# Patient Record
Sex: Male | Born: 1960 | Race: White | Hispanic: No | Marital: Married | Smoking: Former smoker
Health system: Southern US, Community
[De-identification: ages and names within clinical notes are randomized; demographics above are authoritative.]

## PROBLEM LIST (undated history)

## (undated) DIAGNOSIS — E7800 Pure hypercholesterolemia, unspecified: Secondary | ICD-10-CM

## (undated) DIAGNOSIS — M109 Gout, unspecified: Secondary | ICD-10-CM

## (undated) DIAGNOSIS — K219 Gastro-esophageal reflux disease without esophagitis: Secondary | ICD-10-CM

## (undated) HISTORY — DX: Gout, unspecified: M10.9

## (undated) HISTORY — DX: Gastro-esophageal reflux disease without esophagitis: K21.9

## (undated) HISTORY — DX: Pure hypercholesterolemia, unspecified: E78.00

---

## 1998-12-07 ENCOUNTER — Encounter: Payer: Self-pay | Admitting: Gastroenterology

## 1998-12-07 ENCOUNTER — Ambulatory Visit (HOSPITAL_COMMUNITY): Admission: RE | Admit: 1998-12-07 | Discharge: 1998-12-07 | Payer: Self-pay | Admitting: Gastroenterology

## 1998-12-31 ENCOUNTER — Ambulatory Visit (HOSPITAL_COMMUNITY): Admission: RE | Admit: 1998-12-31 | Discharge: 1998-12-31 | Payer: Self-pay | Admitting: Gastroenterology

## 1998-12-31 ENCOUNTER — Encounter: Payer: Self-pay | Admitting: Gastroenterology

## 1999-03-02 ENCOUNTER — Ambulatory Visit (HOSPITAL_COMMUNITY): Admission: RE | Admit: 1999-03-02 | Discharge: 1999-03-02 | Payer: Self-pay | Admitting: Gastroenterology

## 2013-05-01 ENCOUNTER — Ambulatory Visit (INDEPENDENT_AMBULATORY_CARE_PROVIDER_SITE_OTHER): Payer: BC Managed Care – PPO | Admitting: Podiatry

## 2013-05-01 ENCOUNTER — Encounter: Payer: Self-pay | Admitting: Podiatry

## 2013-05-01 ENCOUNTER — Ambulatory Visit (INDEPENDENT_AMBULATORY_CARE_PROVIDER_SITE_OTHER): Payer: BC Managed Care – PPO

## 2013-05-01 VITALS — BP 118/77 | HR 70 | Resp 12 | Ht 74.0 in | Wt 238.0 lb

## 2013-05-01 DIAGNOSIS — R52 Pain, unspecified: Secondary | ICD-10-CM

## 2013-05-01 DIAGNOSIS — M722 Plantar fascial fibromatosis: Secondary | ICD-10-CM

## 2013-05-01 NOTE — Progress Notes (Signed)
N-SORE  L-LT FOOT HEEL D- 1 YEAR O-SLOWLY C-SAME A-PRESSURE T-CORTISONE SHOTS

## 2013-05-01 NOTE — Progress Notes (Signed)
   Subjective:    Patient ID: Raymond Mckenzie, male    DOB: [birth year], 53 y.o.   MRN: [id number]  HPI    Review of Systems  Musculoskeletal: Positive for gait problem.  Neurological: Positive for numbness.  All other systems reviewed and are negative.       Objective:   Physical Exam        Assessment & Plan:

## 2013-05-02 NOTE — Progress Notes (Signed)
Subjective:     Patient ID: Raymond Mckenzie, male   DOB: 1960-07-17, 53 y.o.   MRN: [id number]  HPI patient states that he is having chronic pain in his left heel of at least 18 months duration and states he has had 4 injections physical therapy inserts and stretching exercises without relief of symptoms. He cannot exercise or do any physical activity without pain   Review of Systems  All other systems reviewed and are negative.       Objective:   Physical Exam  Nursing note and vitals reviewed. Constitutional: He is oriented to person, place, and time.  Cardiovascular: Intact distal pulses.   Musculoskeletal: Normal range of motion.  Neurological: He is oriented to person, place, and time.  Skin: Skin is warm.   neurovascular status intact with muscle strength adequate and no equinus condition noted. Severe discomfort to palpation medial calcaneal tubercle left with inflammation of the plantar fascia noted. Mild changes in the lateral side and lateral foot Achilles tendon secondary to compensation in gait pattern    Assessment:     Chronic plantar fasciitis that has failed to respond to numerous conservative care options at this time    Plan:     H&P and x-ray reviewed with patient and wife. Discussed at great length length of condition and different treatments that have been performed up to this point and the consideration long-term for further injection along with immobilization versus shockwave versus open surgery. Due to length of time and failure to respond to numerous conservative care as he has opted for surgery in this case at this time I allowed him to reviewed consent form concerning endoscopic release of the medial band fascia. I explained all possible complications including chronic arch pain foot pain lateral pain and the fact that total recovery period can be 6 months to one year for this procedure. Patient understands all this wants surgery signs consent form and this  dispense air fracture walker with instructions on usage for surgery. Scheduled in the next 2-3 weeks and encouraged to call with any questions

## 2013-05-28 DIAGNOSIS — M722 Plantar fascial fibromatosis: Secondary | ICD-10-CM

## 2013-06-03 ENCOUNTER — Encounter: Payer: BC Managed Care – PPO | Admitting: Podiatry

## 2013-06-03 ENCOUNTER — Ambulatory Visit (INDEPENDENT_AMBULATORY_CARE_PROVIDER_SITE_OTHER): Payer: BC Managed Care – PPO | Admitting: Podiatry

## 2013-06-03 VITALS — BP 143/89 | HR 82 | Resp 16 | Ht 74.0 in | Wt 238.0 lb

## 2013-06-03 DIAGNOSIS — M722 Plantar fascial fibromatosis: Secondary | ICD-10-CM

## 2013-06-03 NOTE — Progress Notes (Signed)
Pt states he's doing fine and ready to get to work and back into a regular shoe.

## 2013-06-03 NOTE — Progress Notes (Signed)
Subjective:     Patient ID: Raymond Mckenzie, male   DOB: 10/31/1960, 53 y.o.   MRN: [id number]  HPI patient states that I am doing fine and walking with minimal discomfort on my heel   Review of Systems     Objective:   Physical Exam Neurovascular status intact with negative Homans sign noted and incision sites that are coapted well with stitches intact both sides of the heel with minimal edema erythema or drainage not noted    Assessment:     Healing well post endoscopic surgery of the heel    Plan:     Reapplied sterile dressing dispense Darco shoe and instructed on continued immobilization until I see patient back in 2 weeks. Reappoint earlier if any issues should occur

## 2013-06-19 ENCOUNTER — Encounter: Payer: Self-pay | Admitting: Podiatry

## 2013-06-19 ENCOUNTER — Ambulatory Visit (INDEPENDENT_AMBULATORY_CARE_PROVIDER_SITE_OTHER): Payer: BC Managed Care – PPO | Admitting: Podiatry

## 2013-06-19 VITALS — BP 122/69 | HR 69 | Resp 12

## 2013-06-19 DIAGNOSIS — M722 Plantar fascial fibromatosis: Secondary | ICD-10-CM

## 2013-06-21 NOTE — Progress Notes (Signed)
Subjective:     Patient ID: Colletta Marylandoger D Thurlow JR, male   DOB: 08/10/1960, 53 y.o.   MRN: [id number]  HPI patient presents stating he is doing fairly well with his foot and heel surgery. 3 weeks after procedure  Review of Systems     Objective:   Physical Exam Neurovascular status intact negative Homans sign noted with wound edges well corrected stitches in place with no drainage redness and no plantar pain in the heel note    Assessment:     Doing very well endoscopic release fascia    Plan:     Stitches are removed wound edges coapted well applied sterile dressing and dispensed anklet. Gradual continued return to saw shoe gear with boot usage as needed and reappoint if any issues should occur

## 2013-08-29 ENCOUNTER — Ambulatory Visit (INDEPENDENT_AMBULATORY_CARE_PROVIDER_SITE_OTHER): Payer: BC Managed Care – PPO

## 2013-08-29 ENCOUNTER — Ambulatory Visit (INDEPENDENT_AMBULATORY_CARE_PROVIDER_SITE_OTHER): Payer: BC Managed Care – PPO | Admitting: Podiatry

## 2013-08-29 VITALS — BP 127/89 | HR 74 | Resp 15 | Ht 74.0 in | Wt 235.0 lb

## 2013-08-29 DIAGNOSIS — M79609 Pain in unspecified limb: Secondary | ICD-10-CM

## 2013-08-29 DIAGNOSIS — M7750 Other enthesopathy of unspecified foot: Secondary | ICD-10-CM

## 2013-08-29 MED ORDER — TRIAMCINOLONE ACETONIDE 10 MG/ML IJ SUSP
10.0000 mg | Freq: Once | INTRAMUSCULAR | Status: AC
  Administered 2013-08-29: 10 mg

## 2013-08-29 MED ORDER — DICLOFENAC SODIUM 75 MG PO TBEC: 75.0000 mg | DELAYED_RELEASE_TABLET | Freq: Two times a day (BID) | ORAL | Status: DC

## 2013-08-29 NOTE — Progress Notes (Signed)
   Subjective:    Patient ID: Raymond Mckenzie, male    DOB: 04-Feb-1961, 53 y.o.   MRN: [id number]  HPI Comments: Pt complains of soreness in left 2 - 4 MPJ through the day, worsening, and worse still when push off toes to step and to dorsiflex.  Pt states this has been going on 1 month.     Review of Systems  All other systems reviewed and are negative.      Objective:   Physical Exam        Assessment & Plan:

## 2013-08-29 NOTE — Progress Notes (Signed)
Subjective:     Patient ID: Raymond Mckenzie JR, male   DOB: [birth year], 53 y.o.   MRN: [id number]  HPI patient presents stating my heel is doing really well but I getting discomfort in my forefoot with pain if I try to be very active on. Several months after having plantar fascial release left   Review of Systems     Objective:   Physical Exam Neurovascular status intact no health history changes noted with inflammation mostly centered in the second metatarsophalangeal joint left with mild discomfort across the entire metatarsophalangeal area    Assessment:     Acute capsulitis of the second MPJ left with probable change in gait and improvement with gait process do to heal feeling better as a issue with this    Plan:     Reviewed the condition and x-ray. Did a proximal nerve block and then after explaining risk of rupture of the joint aspirated the second MPJ and injected with half cc of dexamethasone Kenalog and applied thick plantar padding. Scanned for custom orthotics to reduce all forefoot stress and patient will be seen back when those are returned

## 2013-09-02 ENCOUNTER — Ambulatory Visit: Payer: BC Managed Care – PPO | Admitting: Podiatry

## 2013-09-20 ENCOUNTER — Ambulatory Visit (INDEPENDENT_AMBULATORY_CARE_PROVIDER_SITE_OTHER): Payer: BC Managed Care – PPO | Admitting: *Deleted

## 2013-09-20 DIAGNOSIS — M722 Plantar fascial fibromatosis: Secondary | ICD-10-CM

## 2013-09-20 NOTE — Patient Instructions (Signed)

## 2013-09-20 NOTE — Progress Notes (Signed)
   Subjective:    Patient ID: Raymond Mckenzie, male    DOB: 1961/02/13, 53 y.o.   MRN: [id number]  HPI PICK UP ORTHOTICS AND GIVEN INSTRUCTION.    Review of Systems     Objective:   Physical Exam        Assessment & Plan:

## 2013-09-26 ENCOUNTER — Ambulatory Visit: Payer: BC Managed Care – PPO | Admitting: Podiatry

## 2014-09-05 ENCOUNTER — Ambulatory Visit (INDEPENDENT_AMBULATORY_CARE_PROVIDER_SITE_OTHER): Payer: BLUE CROSS/BLUE SHIELD

## 2014-09-05 ENCOUNTER — Ambulatory Visit (INDEPENDENT_AMBULATORY_CARE_PROVIDER_SITE_OTHER): Payer: BLUE CROSS/BLUE SHIELD | Admitting: Podiatry

## 2014-09-05 DIAGNOSIS — R52 Pain, unspecified: Secondary | ICD-10-CM | POA: Diagnosis not present

## 2014-09-05 DIAGNOSIS — M722 Plantar fascial fibromatosis: Secondary | ICD-10-CM | POA: Diagnosis not present

## 2014-09-05 MED ORDER — TRIAMCINOLONE ACETONIDE 10 MG/ML IJ SUSP
10.0000 mg | Freq: Once | INTRAMUSCULAR | Status: AC
  Administered 2014-09-05: 10 mg

## 2014-09-05 NOTE — Progress Notes (Signed)
   Subjective:    Patient ID: Raymond Mckenzie, male    DOB: 03/10/1961, 54 y.o.   MRN: [id number]  HPI  Right heel pain     Review of Systems  All other systems reviewed and are negative.      Objective:   Physical Exam        Assessment & Plan:

## 2014-09-07 NOTE — Progress Notes (Signed)
Subjective:     Patient ID: Raymond Mckenzie, male   DOB: [birth year], 54 y.o.   MRN: [id number]  HPI patient states my right heel has started to hurt me quite a bit and is sore when I pressed and when I get up in the morning or after sitting   Review of Systems     Objective:   Physical Exam Neurovascular status intact muscle strength adequate range of motion within normal limits. Pain in the plantar aspect right heel at the insertional point of the tendon into the calcaneus    Assessment:     Plantar fasciitis acute nature right    Plan:     Injected the right plantar fascia 3 Milligan Kenalog 5 mg Xylocaine and advised on physical therapy and supportive shoe gear usage

## 2014-09-25 ENCOUNTER — Ambulatory Visit: Payer: BLUE CROSS/BLUE SHIELD | Admitting: Podiatry

## 2015-03-04 ENCOUNTER — Ambulatory Visit (INDEPENDENT_AMBULATORY_CARE_PROVIDER_SITE_OTHER): Payer: BLUE CROSS/BLUE SHIELD | Admitting: Podiatry

## 2015-03-04 ENCOUNTER — Encounter: Payer: Self-pay | Admitting: Podiatry

## 2015-03-04 VITALS — BP 130/80 | HR 64 | Resp 12

## 2015-03-04 DIAGNOSIS — M722 Plantar fascial fibromatosis: Secondary | ICD-10-CM

## 2015-03-04 DIAGNOSIS — M2042 Other hammer toe(s) (acquired), left foot: Secondary | ICD-10-CM

## 2015-03-04 MED ORDER — TRIAMCINOLONE ACETONIDE 10 MG/ML IJ SUSP
10.0000 mg | Freq: Once | INTRAMUSCULAR | Status: AC
  Administered 2015-03-04: 10 mg

## 2015-03-04 MED ORDER — MELOXICAM 15 MG PO TABS: 15.0000 mg | ORAL_TABLET | Freq: Every day | ORAL | Status: DC

## 2015-03-04 NOTE — Progress Notes (Signed)
Subjective:     Patient ID: Raymond RetortRoger D Mckenzie, male   DOB: [birth year], 54 y.o.   MRN: [id number]  HPI patient presents with right foot pain stating his heel has been hurting him for the last few months   Review of Systems     Objective:   Physical Exam  neurovascular status intact muscle strength adequate with exquisite discomfort plantar aspect right heel at the insertional point tendon calcaneus    Assessment:      plantar fasciitis right    Plan:      injected the right plantar fascia 3 mg Kenalog 5 mill grams Xylocaine and discussed possible surgery of the symptoms persist or come back quickly

## 2015-07-01 ENCOUNTER — Ambulatory Visit (INDEPENDENT_AMBULATORY_CARE_PROVIDER_SITE_OTHER): Payer: BLUE CROSS/BLUE SHIELD | Admitting: Podiatry

## 2015-07-01 ENCOUNTER — Encounter: Payer: Self-pay | Admitting: Podiatry

## 2015-07-01 ENCOUNTER — Ambulatory Visit (INDEPENDENT_AMBULATORY_CARE_PROVIDER_SITE_OTHER): Payer: BLUE CROSS/BLUE SHIELD

## 2015-07-01 VITALS — BP 133/82 | HR 66 | Resp 16

## 2015-07-01 DIAGNOSIS — M79671 Pain in right foot: Secondary | ICD-10-CM

## 2015-07-01 DIAGNOSIS — M722 Plantar fascial fibromatosis: Secondary | ICD-10-CM | POA: Diagnosis not present

## 2015-07-01 NOTE — Patient Instructions (Signed)
Pre-Operative Instructions  Congratulations, you have decided to take an important step to improving your quality of life.  You can be assured that the doctors of [facility] will be with you every step of the way.  1. Plan to be at the surgery center/hospital at least 1 (one) hour prior to your scheduled time unless otherwise directed by the surgical center/hospital staff.  You must have a responsible adult accompany you, remain during the surgery and drive you home.  Make sure you have directions to the surgical center/hospital and know how to get there on time. 2. For hospital based surgery you will need to obtain a history and physical form from your family physician within 1 month prior to the date of surgery- we will give you a form for you primary physician.  3. We make every effort to accommodate the date you request for surgery.  There are however, times where surgery dates or times have to be moved.  We will contact you as soon as possible if a change in schedule is required.   4. No Aspirin/Ibuprofen for one week before surgery.  If you are on aspirin, any non-steroidal anti-inflammatory medications (Mobic, Aleve, Ibuprofen) you should stop taking it 7 days prior to your surgery.  You make take Tylenol  For pain prior to surgery.  5. Medications- If you are taking daily heart and blood pressure medications, seizure, reflux, allergy, asthma, anxiety, pain or diabetes medications, make sure the surgery center/hospital is aware before the day of surgery so they may notify you which medications to take or avoid the day of surgery. 6. No food or drink after midnight the night before surgery unless directed otherwise by surgical center/hospital staff. 7. No alcoholic beverages 24 hours prior to surgery.  No smoking 24 hours prior to or 24 hours after surgery. 8. Wear loose pants or shorts- loose enough to fit over bandages, boots, and casts. 9. No slip on shoes, sneakers are best. 10. Bring  your boot with you to the surgery center/hospital.  Also bring crutches or a walker if your physician has prescribed it for you.  If you do not have this equipment, it will be provided for you after surgery. 11. If you have not been contracted by the surgery center/hospital by the day before your surgery, call to confirm the date and time of your surgery. 12. Leave-time from work may vary depending on the type of surgery you have.  Appropriate arrangements should be made prior to surgery with your employer. 13. Prescriptions will be provided immediately following surgery by your doctor.  Have these filled as soon as possible after surgery and take the medication as directed. 14. Remove nail polish on the operative foot. 15. Wash the night before surgery.  The night before surgery wash the foot and leg well with the antibacterial soap provided and water paying special attention to beneath the toenails and in between the toes.  Rinse thoroughly with water and dry well with a towel.  Perform this wash unless told not to do so by your physician.  Enclosed: 1 Ice pack (please put in freezer the night before surgery)   1 Hibiclens skin cleaner   Pre-op Instructions  If you have any questions regarding the instructions, do not hesitate to call our office.  [place]: [address] [place], [place] [address] [phone]  [place]: [address] [place], [place] [address] [phone]  [place]: [address]  [place], [place] [address] [phone]  Dr. Richard   Tuchman DPM, Dr. Norman Regal DPM Dr. Richard Sikora DPM, Dr. M. Todd Hyatt DPM, Dr. Kathryn Egerton DPM 

## 2015-07-02 NOTE — Progress Notes (Signed)
Subjective:     Patient ID: Raymond Mckenzie, male   DOB: 1961-01-04, 55 y.o.   MRN: [id number]  HPI patient states his right heel is driving him crazy and he wants to get it fixed like his left heel. States that the medication did not give him relief and orthotics and splints are not helping   Review of Systems     Objective:   Physical Exam Neurovascular status intact with severe discomfort plantar aspect right heel at the insertional point tendon into the calcaneus with well-healed surgical site left with no pain    Assessment:     Severe plantar fasciitis right that failed to respond conservatively with excellent result concerning the left heel    Plan:     Reviewed condition at great length and at this time allow patient to read consent form going over alternative treatments and complications. Due to long-term problems with this patient has opted for surgery and after reviewing consent form signs consent form understanding total recovery can take from 6 months to one year and that just because 1 did so well does not mean that the other will recover the same way. He understands this completely and is scheduled for outpatient surgery and is given all preoperative instructions and is encouraged to call with any questions he may have prior to procedure

## 2015-07-07 DIAGNOSIS — M722 Plantar fascial fibromatosis: Secondary | ICD-10-CM | POA: Diagnosis not present

## 2015-07-13 ENCOUNTER — Ambulatory Visit (INDEPENDENT_AMBULATORY_CARE_PROVIDER_SITE_OTHER): Payer: BLUE CROSS/BLUE SHIELD | Admitting: Podiatry

## 2015-07-13 ENCOUNTER — Encounter: Payer: Self-pay | Admitting: Podiatry

## 2015-07-13 VITALS — Temp 98.3°F

## 2015-07-13 DIAGNOSIS — M722 Plantar fascial fibromatosis: Secondary | ICD-10-CM

## 2015-07-13 DIAGNOSIS — Z9889 Other specified postprocedural states: Secondary | ICD-10-CM | POA: Diagnosis not present

## 2015-07-14 NOTE — Progress Notes (Signed)
Subjective:     Patient ID: Raymond RetortRoger D Jemison, male   DOB: 1960/11/06, 55 y.o.   MRN: [id number]  HPI patient states I'm doing very well with my right heel with minimal discomfort   Review of Systems     Objective:   Physical Exam Neurovascular status intact negative Homans sign noted with patient's right heel showing good alignment with minimal edema and wound edges that are well coapted with stitches in place    Assessment:     Doing well post endoscopic release of the medial fascial band right    Plan:     Reviewed condition and advised this patient on continued immobilization elevation and if any pain in the leg or any indications of problems were to occur to contact us immediately. Reappoint 2 weeks for suture removal or earlier if needed

## 2015-07-16 ENCOUNTER — Other Ambulatory Visit: Payer: Self-pay

## 2015-07-29 ENCOUNTER — Ambulatory Visit (INDEPENDENT_AMBULATORY_CARE_PROVIDER_SITE_OTHER): Payer: BLUE CROSS/BLUE SHIELD | Admitting: Podiatry

## 2015-07-29 DIAGNOSIS — Z9889 Other specified postprocedural states: Secondary | ICD-10-CM | POA: Diagnosis not present

## 2015-07-29 DIAGNOSIS — M722 Plantar fascial fibromatosis: Secondary | ICD-10-CM | POA: Diagnosis not present

## 2015-07-29 NOTE — Progress Notes (Signed)
Subjective:     Patient ID: Raymond Mckenzie, male   DOB: Oct 25, 1960, 55 y.o.   MRN: [id number]  HPI patient states my right foot is doing well with stitches intact and minimal discomfort   Review of Systems     Objective:   Physical Exam Neurovascular status intact negative Homans sign noted with well healing surgical sites right and left plantar heel    Assessment:     Doing well post endoscopic surgery right heel    Plan:     Reviewed condition remove stitches applied sterile dressings dispensed ankle brace and instructed on gradual increase in activity levels. Reappoint as needed

## 2015-08-04 ENCOUNTER — Encounter: Payer: Self-pay | Admitting: *Deleted

## 2015-08-06 ENCOUNTER — Encounter: Payer: Self-pay | Admitting: *Deleted

## 2015-08-06 ENCOUNTER — Other Ambulatory Visit: Payer: Self-pay | Admitting: *Deleted

## 2015-08-11 ENCOUNTER — Ambulatory Visit: Payer: Self-pay | Admitting: Cardiovascular Disease

## 2015-08-25 ENCOUNTER — Encounter: Payer: Self-pay | Admitting: Cardiovascular Disease

## 2015-08-25 ENCOUNTER — Ambulatory Visit (INDEPENDENT_AMBULATORY_CARE_PROVIDER_SITE_OTHER): Payer: BLUE CROSS/BLUE SHIELD | Admitting: Cardiovascular Disease

## 2015-08-25 VITALS — BP 122/82 | HR 62 | Ht 74.0 in | Wt 240.0 lb

## 2015-08-25 DIAGNOSIS — R0602 Shortness of breath: Secondary | ICD-10-CM | POA: Diagnosis not present

## 2015-08-25 DIAGNOSIS — Z136 Encounter for screening for cardiovascular disorders: Secondary | ICD-10-CM | POA: Diagnosis not present

## 2015-08-25 NOTE — Progress Notes (Signed)
Patient ID: Raymond Mckenzie Tye, male   DOB: Mar 07, 1961, 55 y.o.   MRN: [id number]       CARDIOLOGY CONSULT NOTE  Patient ID: Raymond Mckenzie Frediani MRN: [id number] DOB/AGE: Mar 07, 1961 55 y.o.  Admit date: (Not on file) Primary Physician: Kirstie PeriSHAH,ASHISH, MD Referring Physician: Sherryll BurgerShah MD  Reason for Consultation: Shortness of breath  HPI: The patient is a 55 year old male with a history of acid reflux disease and hypothyroidism who is referred for the evaluation of shortness of breath and fatigue.  Echocardiogram in August 2011 demonstrated normal left ventricular systolic function, mild LVH, and moderate tricuspid regurgitation.  He underwent a normal nuclear stress test at [facility] in March 2015.  Lipids on 07/28/15 showed total cholesterol 199, triglycerides 129, HDL 36, LDL 137.  ECG performed by PCP which I personally interpreted demonstrated sinus rhythm with no ischemic ST segment or T-wave abnormalities, nor any arrhythmias.  ECG performed in the office today which I personally interpreted demonstrated sinus rhythm with PACs and a nonspecific T wave abnormality.   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 In 2011 he had some shortness of breath while climbing stairs and this was the reason the echocardiogram was performed. He said he can lose focus while having conversations. He wonders if it is due to anxiety. His PCP prescribed Xanax. He had a near syncopal episode in 2015 and for this reason underwent stress testing which was normal as noted above. He denies exertional chest pain and shortness of breath. Symptoms usually occur while at rest or while watching TV. His wife says he snores a lot. He has never been tested for sleep apnea. He denies orthopnea and paroxysmal nocturnal dyspnea.    No Known Allergies  Current Outpatient Prescriptions  Medication Sig Dispense Refill  . ALPRAZolam (XANAX) 0.25 MG tablet Take 0.25 mg by mouth at bedtime as needed for anxiety.    Marland Kitchen.  aspirin EC 81 MG tablet Take 81 mg by mouth daily.    Marland Kitchen. levothyroxine (SYNTHROID, LEVOTHROID) 200 MCG tablet Take 200 mcg by mouth daily before breakfast.     No current facility-administered medications for this visit.    Past Medical History  Diagnosis Date  . GERD (gastroesophageal reflux disease)   . Gout   . Hypercholesterolemia     No past surgical history on file.  Social History   Social History  . Marital Status: Married    Spouse Name: N/A  . Number of Children: N/A  . Years of Education: N/A   Occupational History  . Not on file.   Social History Main Topics  . Smoking status: Former Smoker -- 1.00 packs/day    Types: Cigarettes    Quit date: 08/24/2005  . Smokeless tobacco: Never Used  . Alcohol Use: No  . Drug Use: No  . Sexual Activity: Not on file   Other Topics Concern  . Not on file   Social History Narrative     No family history of premature CAD in 1st degree relatives.  Prior to Admission medications   Medication Sig Start Date End Date Taking? Authorizing Provider  ALPRAZolam Prudy Feeler(XANAX) 0.25 MG tablet Take 0.25 mg by mouth at bedtime as needed for anxiety.    Historical Provider, MD  aspirin EC 81 MG tablet Take 81 mg by mouth daily.    Historical Provider, MD  levothyroxine (SYNTHROID, LEVOTHROID) 75 MCG tablet Take 75 mcg by mouth daily before breakfast.    Historical Provider, MD  omeprazole (PRILOSEC) 20 MG capsule Take 20 mg by mouth daily.    Historical Provider, MD  SYNTHROID 200 MCG tablet Take 1 tablet by mouth daily. 07/02/15   Historical Provider, MD  tamsulosin (FLOMAX) 0.4 MG CAPS capsule Take 1 capsule by mouth daily. 07/02/15   Historical Provider, MD     Review of systems complete and found to be negative unless listed above in HPI     Physical exam Blood pressure 122/82, pulse 62, height  (1.88 m), weight 240 lb (108.863 kg), SpO2 98 %. General: NAD Neck: No JVD, no thyromegaly or thyroid nodule.  Lungs: Clear to  auscultation bilaterally with normal respiratory effort. CV: Nondisplaced PMI. Regular rate and rhythm, normal S1/S2, no S3/S4, no murmur.  No peripheral edema.  No carotid bruit.  Normal pedal pulses.  Abdomen: Obese. Skin: Intact without lesions or rashes.  Neurologic: Alert and oriented x 3.  Psych: Normal affect. Extremities: No clubbing or cyanosis.  HEENT: Normal.   ECG: Most recent ECG reviewed.  Labs:  No results found for: WBC, HGB, HCT, MCV, PLT No results for input(s): NA, K, CL, CO2, BUN, CREATININE, CALCIUM, PROT, BILITOT, ALKPHOS, ALT, AST, GLUCOSE in the last 168 hours.  Invalid input(s): LABALBU No results found for: CKTOTAL, CKMB, CKMBINDEX, TROPONINI No results found for: CHOL No results found for: HDL No results found for: LDLCALC No results found for: TRIG No results found for: CHOLHDL No results found for: LDLDIRECT       Studies: No results found.  ASSESSMENT AND PLAN:  1. Shortness of breath: Symptoms appear to be due to an underlying generalized anxiety disorder. He does have thyroid disease which can contribute to this to some degree.  I do not feel cardiovascular testing is warranted at this time.   Dispo: fu prn.   Signed: Prentice Docker, M.Mckenzie., F.A.C.C.  08/25/2015, 1:57 PM

## 2015-08-25 NOTE — Patient Instructions (Signed)
Continue all current medications. Follow up as needed  

## 2015-12-18 ENCOUNTER — Encounter: Payer: Self-pay | Admitting: *Deleted

## 2015-12-18 NOTE — Progress Notes (Signed)
   DOS 07-07-15  EPF right foot

## 2020-03-16 ENCOUNTER — Emergency Department (HOSPITAL_COMMUNITY)
Admission: EM | Admit: 2020-03-16 | Discharge: 2020-03-17 | Disposition: A | Discharge status: Home / Self Care | Payer: BC Managed Care – PPO | Attending: Emergency Medicine | Admitting: Emergency Medicine

## 2020-03-16 ENCOUNTER — Other Ambulatory Visit: Payer: Self-pay

## 2020-03-16 ENCOUNTER — Emergency Department (HOSPITAL_COMMUNITY): Payer: BC Managed Care – PPO

## 2020-03-16 ENCOUNTER — Encounter (HOSPITAL_COMMUNITY): Payer: Self-pay

## 2020-03-16 DIAGNOSIS — K859 Acute pancreatitis without necrosis or infection, unspecified: Secondary | ICD-10-CM | POA: Diagnosis not present

## 2020-03-16 DIAGNOSIS — R1030 Lower abdominal pain, unspecified: Secondary | ICD-10-CM | POA: Diagnosis present

## 2020-03-16 DIAGNOSIS — N400 Enlarged prostate without lower urinary tract symptoms: Secondary | ICD-10-CM

## 2020-03-16 DIAGNOSIS — K219 Gastro-esophageal reflux disease without esophagitis: Secondary | ICD-10-CM | POA: Insufficient documentation

## 2020-03-16 DIAGNOSIS — Z87891 Personal history of nicotine dependence: Secondary | ICD-10-CM | POA: Diagnosis not present

## 2020-03-16 LAB — COMPREHENSIVE METABOLIC PANEL
ALT: 35 U/L (ref 0–44)
AST: 15 U/L (ref 15–41)
Albumin: 4.2 g/dL (ref 3.5–5.0)
Alkaline Phosphatase: 33 U/L — ABNORMAL LOW (ref 38–126)
Anion gap: 9 (ref 5–15)
BUN: 9 mg/dL (ref 6–20)
CO2: 25 mmol/L (ref 22–32)
Calcium: 8.6 mg/dL — ABNORMAL LOW (ref 8.9–10.3)
Chloride: 102 mmol/L (ref 98–111)
Creatinine, Ser: 0.92 mg/dL (ref 0.61–1.24)
GFR, Estimated: >60 mL/min (ref >60)
Glucose, Bld: 119 mg/dL — ABNORMAL HIGH (ref 70–99)
Potassium: 3.6 mmol/L (ref 3.5–5.1)
Sodium: 136 mmol/L (ref 135–145)
Total Bilirubin: 0.8 mg/dL (ref 0.3–1.2)
Total Protein: 7.5 g/dL (ref 6.5–8.1)

## 2020-03-16 LAB — CBC
HCT: 47.1 % (ref 39.0–52.0)
Hemoglobin: 16 g/dL (ref 13.0–17.0)
MCH: 29.1 pg (ref 26.0–34.0)
MCHC: 34 g/dL (ref 30.0–36.0)
MCV: 85.8 fL (ref 80.0–100.0)
Platelets: 238 10*3/uL (ref 150–400)
RBC: 5.49 MIL/uL (ref 4.22–5.81)
RDW: 12.9 % (ref 11.5–15.5)
WBC: 9.5 10*3/uL (ref 4.0–10.5)
nRBC: 0 % (ref 0.0–0.2)

## 2020-03-16 LAB — TROPONIN I (HIGH SENSITIVITY): Troponin I (High Sensitivity): 4 ng/L (ref <18)

## 2020-03-16 LAB — LIPASE, BLOOD: Lipase: 75 U/L — ABNORMAL HIGH (ref 11–51)

## 2020-03-16 MED ORDER — SODIUM CHLORIDE 0.9 % IV BOLUS
500.0000 mL | Freq: Once | INTRAVENOUS | Status: AC
  Administered 2020-03-16: 500 mL via INTRAVENOUS

## 2020-03-16 MED ORDER — DICYCLOMINE HCL 10 MG PO CAPS
10.0000 mg | ORAL_CAPSULE | Freq: Once | ORAL | Status: AC
  Administered 2020-03-16: 10 mg via ORAL
  Filled 2020-03-16: qty 1

## 2020-03-16 MED ORDER — IOHEXOL 300 MG/ML  SOLN
100.0000 mL | Freq: Once | INTRAMUSCULAR | Status: AC | PRN
  Administered 2020-03-16: 100 mL via INTRAVENOUS

## 2020-03-16 MED ORDER — ONDANSETRON HCL 4 MG/2ML IJ SOLN
4.0000 mg | Freq: Once | INTRAMUSCULAR | Status: AC
  Administered 2020-03-16: 4 mg via INTRAVENOUS
  Filled 2020-03-16: qty 2

## 2020-03-16 NOTE — ED Notes (Signed)
Pt provided with UA cup and informed that we needed a sample when he's able.

## 2020-03-16 NOTE — ED Provider Notes (Signed)
Emergency Department Provider Note   I have reviewed the triage vital signs and the nursing notes.   HISTORY  Chief Complaint Abdominal Pain   HPI Raymond Mckenzie is a 59 y.o. male with PMH of GERD and HLD presents to the ED with mid/lower abdominal discomfort, urine frequency, and occasional CP with nausea. The patient's CP is present with moving the left arm only. No exertional or pleuritic symptoms. He notes 2 months of occasional post-prandial pain in the abdomen and nausea/vomiting. His symptoms have since become more constant and not associated with food. In the last several days he has mid/lower abdominal pain radiation to the bilateral flanks and mid-back. No fever or chills. Denies dysuria but has frequency and some urgency. No recent vomiting. He is having BMs. No respiratory symptoms or SOB. He went to see his PCP today and was given Prilosec but family convinced him to present to the ED with concern for some other cause of symptoms. Patient is having some active discomfort and mild, intermittent nausea currently.   Past Medical History:  Diagnosis Date  . GERD (gastroesophageal reflux disease)   . Gout   . Hypercholesterolemia     Patient Active Problem List   Diagnosis Date Noted  . SOB (shortness of breath) 08/25/2015    History reviewed. No pertinent surgical history.  Allergies Patient has no known allergies.  No family history on file.  Social History Social History   Tobacco Use  . Smoking status: Former Smoker    Packs/day: 1.00    Types: Cigarettes    Quit date: 08/24/2005    Years since quitting: 14.5  . Smokeless tobacco: Never Used  Substance Use Topics  . Alcohol use: No    Alcohol/week: 0.0 standard drinks  . Drug use: No    Review of Systems  Constitutional: No fever/chills Eyes: No visual changes. ENT: No sore throat. Cardiovascular: Occasional chest pain with left arm movement.  Respiratory: Denies shortness of  breath. Gastrointestinal: Positive abdominal pain.  Occasional nausea. Remote history of vomiting.  No diarrhea.  No constipation. Genitourinary: Negative for dysuria. Positive frequency and urgency.  Musculoskeletal: Positive for back pain. Skin: Negative for rash. Neurological: Negative for headaches, focal weakness or numbness.  10-point ROS otherwise negative.  ____________________________________________   PHYSICAL EXAM:  VITAL SIGNS: ED Triage Vitals  Enc Vitals Group     BP 03/16/20 2146 (!) 162/106     Pulse Rate 03/16/20 2146 97     Resp 03/16/20 2146 18     Temp 03/16/20 2146 98.3 F (36.8 C)     Temp Source 03/16/20 2146 Oral     SpO2 03/16/20 2146 94 %     Weight 03/16/20 2147 250 lb (113.4 kg)     Height 03/16/20 2147 6\' 2"  (1.88 m)   Constitutional: Alert and oriented. Well appearing and in no acute distress. Eyes: Conjunctivae are normal.  Head: Atraumatic. Nose: No congestion/rhinnorhea. Mouth/Throat: Mucous membranes are moist.  Neck: No stridor.   Cardiovascular: Normal rate, regular rhythm. Good peripheral circulation. Grossly normal heart sounds.   Respiratory: Normal respiratory effort.  No retractions. Lungs CTAB. Gastrointestinal: Soft with mild mid and RLQ tenderness. No rebound or guarding. Ventral hernia noted with valsalva. Hernia is soft and non-tender. No distention.  Musculoskeletal:  No gross deformities of extremities. Neurologic:  Normal speech and language.  Skin:  Skin is warm, dry and intact. No rash noted.  ____________________________________________   LABS (all labs ordered are listed, but  only abnormal results are displayed)  Labs Reviewed  LIPASE, BLOOD - Abnormal; Notable for the following components:      Result Value   Lipase 75 (*)    All other components within normal limits  COMPREHENSIVE METABOLIC PANEL - Abnormal; Notable for the following components:   Glucose, Bld 119 (*)    Calcium 8.6 (*)    Alkaline Phosphatase  33 (*)    All other components within normal limits  URINALYSIS, ROUTINE W REFLEX MICROSCOPIC - Abnormal; Notable for the following components:   Hgb urine dipstick SMALL (*)    Ketones, ur 5 (*)    All other components within normal limits  URINE CULTURE  CBC  TROPONIN I (HIGH SENSITIVITY)  TROPONIN I (HIGH SENSITIVITY)   ____________________________________________  EKG   EKG Interpretation  Date/Time:  Monday March 16 2020 23:43:39 EST Ventricular Rate:  79 PR Interval:    QRS Duration: 95 QT Interval:  387 QTC Calculation: 444 R Axis:   54 Text Interpretation: Sinus rhythm Nonspecific ST changes Baseline wander in lead(s) II III aVF No STEMI Confirmed by Alona Bene [id number]) on 03/16/2020 11:52:26 PM       ____________________________________________  RADIOLOGY  DG Chest 2 View  Result Date: 03/17/2020 CLINICAL DATA:  Chest pain. EXAM: CHEST - 2 VIEW COMPARISON:  None. FINDINGS: The heart size and mediastinal contours are within normal limits. Both lungs are clear. The visualized skeletal structures are unremarkable. IMPRESSION: No active cardiopulmonary disease. Electronically Signed   By: Katherine Mantle M.D.   On: 03/17/2020 00:26   CT ABDOMEN PELVIS W CONTRAST  Result Date: 03/17/2020 CLINICAL DATA:  Abdominal pain EXAM: CT ABDOMEN AND PELVIS WITH CONTRAST TECHNIQUE: Multidetector CT imaging of the abdomen and pelvis was performed using the standard protocol following bolus administration of intravenous contrast. CONTRAST:  OMNIPAQUE IOHEXOL 300 MG/ML  SOLN COMPARISON:  None. FINDINGS: Lower chest: The lung bases are clear. The heart size is normal. Hepatobiliary: There is decreased hepatic attenuation suggestive of hepatic steatosis. Normal gallbladder.There is no biliary ductal dilation. Pancreas: There is mild peripancreatic fat stranding about the pancreatic head. Spleen: The spleen is mildly enlarged measuring approximately 13 cm craniocaudad.  Adrenals/Urinary Tract: --Adrenal glands: Unremarkable. --Right kidney/ureter: No hydronephrosis or radiopaque kidney stones. --Left kidney/ureter: No hydronephrosis or radiopaque kidney stones. --Urinary bladder: Unremarkable. Stomach/Bowel: --Stomach/Duodenum: No hiatal hernia or other gastric abnormality. Normal duodenal course and caliber. --Small bowel: Unremarkable. --Colon: Unremarkable. --Appendix: Normal. Vascular/Lymphatic: Normal course and caliber of the major abdominal vessels. --No retroperitoneal lymphadenopathy. --No mesenteric lymphadenopathy. --No pelvic or inguinal lymphadenopathy. Reproductive: The prostate gland is massively enlarged. There is heterogeneous enhancement of the prostate gland with a hyperenhancing 1.7 cm nodule in the right lateral aspect of the prostate gland. Other: No ascites or free air. The abdominal wall is normal. Musculoskeletal. No acute displaced fractures. IMPRESSION: 1. There is mild peripancreatic fat stranding about the pancreatic head, suggestive of acute pancreatitis. Correlation with lipase is recommended. 2. Massively enlarged prostate gland with heterogeneous enhancement and a hyperenhancing 1.7 cm nodule in the right lateral aspect of the prostate gland. Recommend correlation with PSA. 3. Hepatic steatosis. 4. Mild splenomegaly. Electronically Signed   By: Katherine Mantle M.D.   On: 03/17/2020 00:17    ____________________________________________   PROCEDURES  Procedure(s) performed:   Procedures  None  ____________________________________________   INITIAL IMPRESSION / ASSESSMENT AND PLAN / ED COURSE  Pertinent labs & imaging results that were available during my care of the patient  were reviewed by me and considered in my medical decision making (see chart for details).   Patient presents to the ED with abdominal pain, nausea, and likely MSK releated CP. No active CP. No tenderness over patient's known ventral hernia which is soft. Some  lower and RLQ tenderness. Plan for CT abdomen/pelvis and UA which are pending. Labs showing mild lipase elevation. Added troponin but suspect MSK pathology for CP vs GERD rather than ACS/PE.   Patient's troponin is negative.  UA with no sign of infection.  Patient CT abdomen pelvis as to findings of significance.  First, patient has evidence of mild acute pancreatitis which correlates with the patient's lipase which is very minimally elevated to 75.  His symptoms are well controlled here with Zofran and Bentyl.  He does not want stronger pain medications and when he was given here.  He is not having active vomiting.  Patient does not have leukocytosis, fever, elevated bilirubin/LFTs to suspect obstructive cause.  Discussed treatment options in detail with patient and wife at bedside.  With fairly mild symptoms and no vomiting I feel that he can be treated at home.  Patient will adhere mainly to liquid diet and then advance slowly to bland foods over the next several days.  We discussed ED return precautions in detail regarding this.  Patient also found to have significant prostatic hypertrophy.  PSA recommended.  Discussed this in detail with the patient and provided this in writing.  Also provided contact information for urology.  Patient advised to call his PCP in the morning to arrange a follow-up lab appointment and referral. No evidence of UTI. Called in Rx for Flomax as well.   ____________________________________________  FINAL CLINICAL IMPRESSION(S) / ED DIAGNOSES  Final diagnoses:  Acute pancreatitis without infection or necrosis, unspecified pancreatitis type  Enlarged prostate     MEDICATIONS GIVEN DURING THIS VISIT:  Medications  sodium chloride 0.9 % bolus 500 mL (0 mLs Intravenous Stopped 03/17/20 0025)  ondansetron (ZOFRAN) injection 4 mg (4 mg Intravenous Given 03/16/20 2325)  dicyclomine (BENTYL) capsule 10 mg (10 mg Oral Given 03/16/20 2325)  iohexol (OMNIPAQUE) 300 MG/ML  solution 100 mL (100 mLs Intravenous Contrast Given 03/16/20 2354)     NEW OUTPATIENT MEDICATIONS STARTED DURING THIS VISIT:  New Prescriptions   DICYCLOMINE (BENTYL) 20 MG TABLET    Take 1 tablet (20 mg total) by mouth 3 (three) times daily as needed for spasms (abdominal cramping/pain).   ONDANSETRON (ZOFRAN ODT) 4 MG DISINTEGRATING TABLET    Take 1 tablet (4 mg total) by mouth every 8 (eight) hours as needed.   TAMSULOSIN (FLOMAX) 0.4 MG CAPS CAPSULE    Take 1 capsule (0.4 mg total) by mouth daily.    Note:  This document was prepared using Dragon voice recognition software and may include unintentional dictation errors.  Alona Bene, MD, [facility]    Akim Watkinson, Arlyss Repress, MD 03/17/20 Lyda Jester

## 2020-03-16 NOTE — ED Notes (Signed)
Pt transported to CT ?

## 2020-03-16 NOTE — ED Triage Notes (Signed)
Pt presents to ED with complaints of generalized abdominal pain, urinary frequency. Pt denies N/V/D

## 2020-03-17 LAB — URINALYSIS, ROUTINE W REFLEX MICROSCOPIC
Bacteria, UA: NONE SEEN
Bilirubin Urine: NEGATIVE
Glucose, UA: NEGATIVE mg/dL
Ketones, ur: 5 mg/dL — AB
Leukocytes,Ua: NEGATIVE
Nitrite: NEGATIVE
Protein, ur: NEGATIVE mg/dL
Specific Gravity, Urine: 1.016 (ref 1.005–1.030)
pH: 7 (ref 5.0–8.0)

## 2020-03-17 MED ORDER — ONDANSETRON 4 MG PO TBDP: 4.0000 mg | ORAL_TABLET | Freq: Three times a day (TID) | ORAL | 0 refills | Status: AC | PRN

## 2020-03-17 MED ORDER — DICYCLOMINE HCL 20 MG PO TABS: 20.0000 mg | ORAL_TABLET | Freq: Three times a day (TID) | ORAL | 0 refills | Status: AC | PRN

## 2020-03-17 MED ORDER — TAMSULOSIN HCL 0.4 MG PO CAPS: 0.4000 mg | ORAL_CAPSULE | Freq: Every day | ORAL | 0 refills | Status: AC

## 2020-03-17 NOTE — ED Notes (Signed)
Pt back from CT

## 2020-03-17 NOTE — Discharge Instructions (Signed)
You were seen in the ED today with abdominal pain and urine frequency. You have an enlarged prostate and will need close follow up with your PCP to review your CT scan and order a PSA lab test. This may ultimately require referral to a Urologist. I have listed the contact information for a Urologist on your discharge paperwork.   Your CT scans and labs show mild acute pancreatitis. I have called in medications for pain and nausea. To manage this effectively at home you should stick to mainly a bland, liquid diet and advance slowly as tolerated. If you develop worsening pain, fever, chills, or vomiting you should return to the ED for re-evaluation.

## 2020-03-18 LAB — URINE CULTURE: Culture: NO GROWTH

## 2022-06-01 IMAGING — DX DG CHEST 2V
2 series · 2 of 2 positions shown · non-contrast
Comparison: None.

CLINICAL DATA: Chest pain.

EXAM:
CHEST - 2 VIEW

[chest pa]
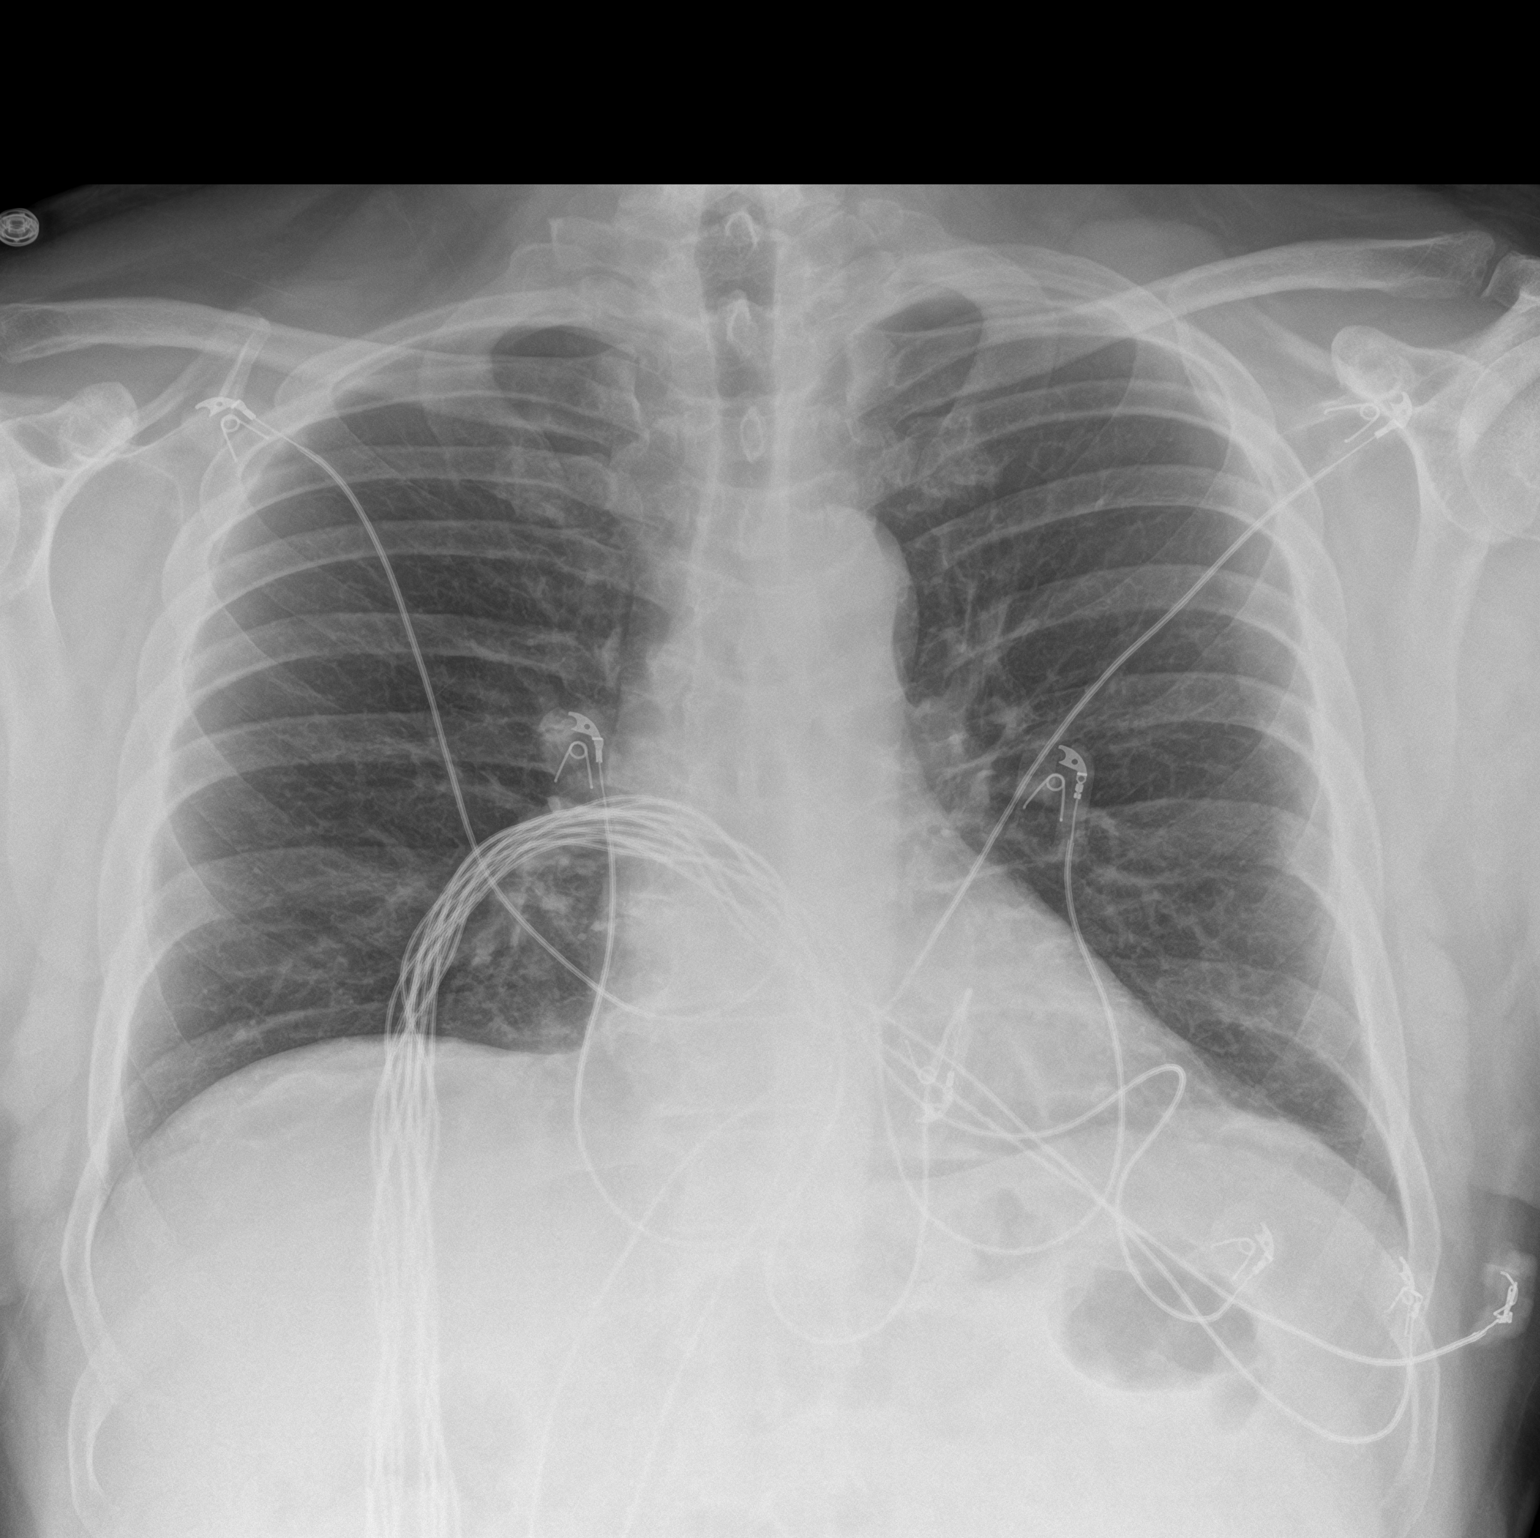

[chest lat]
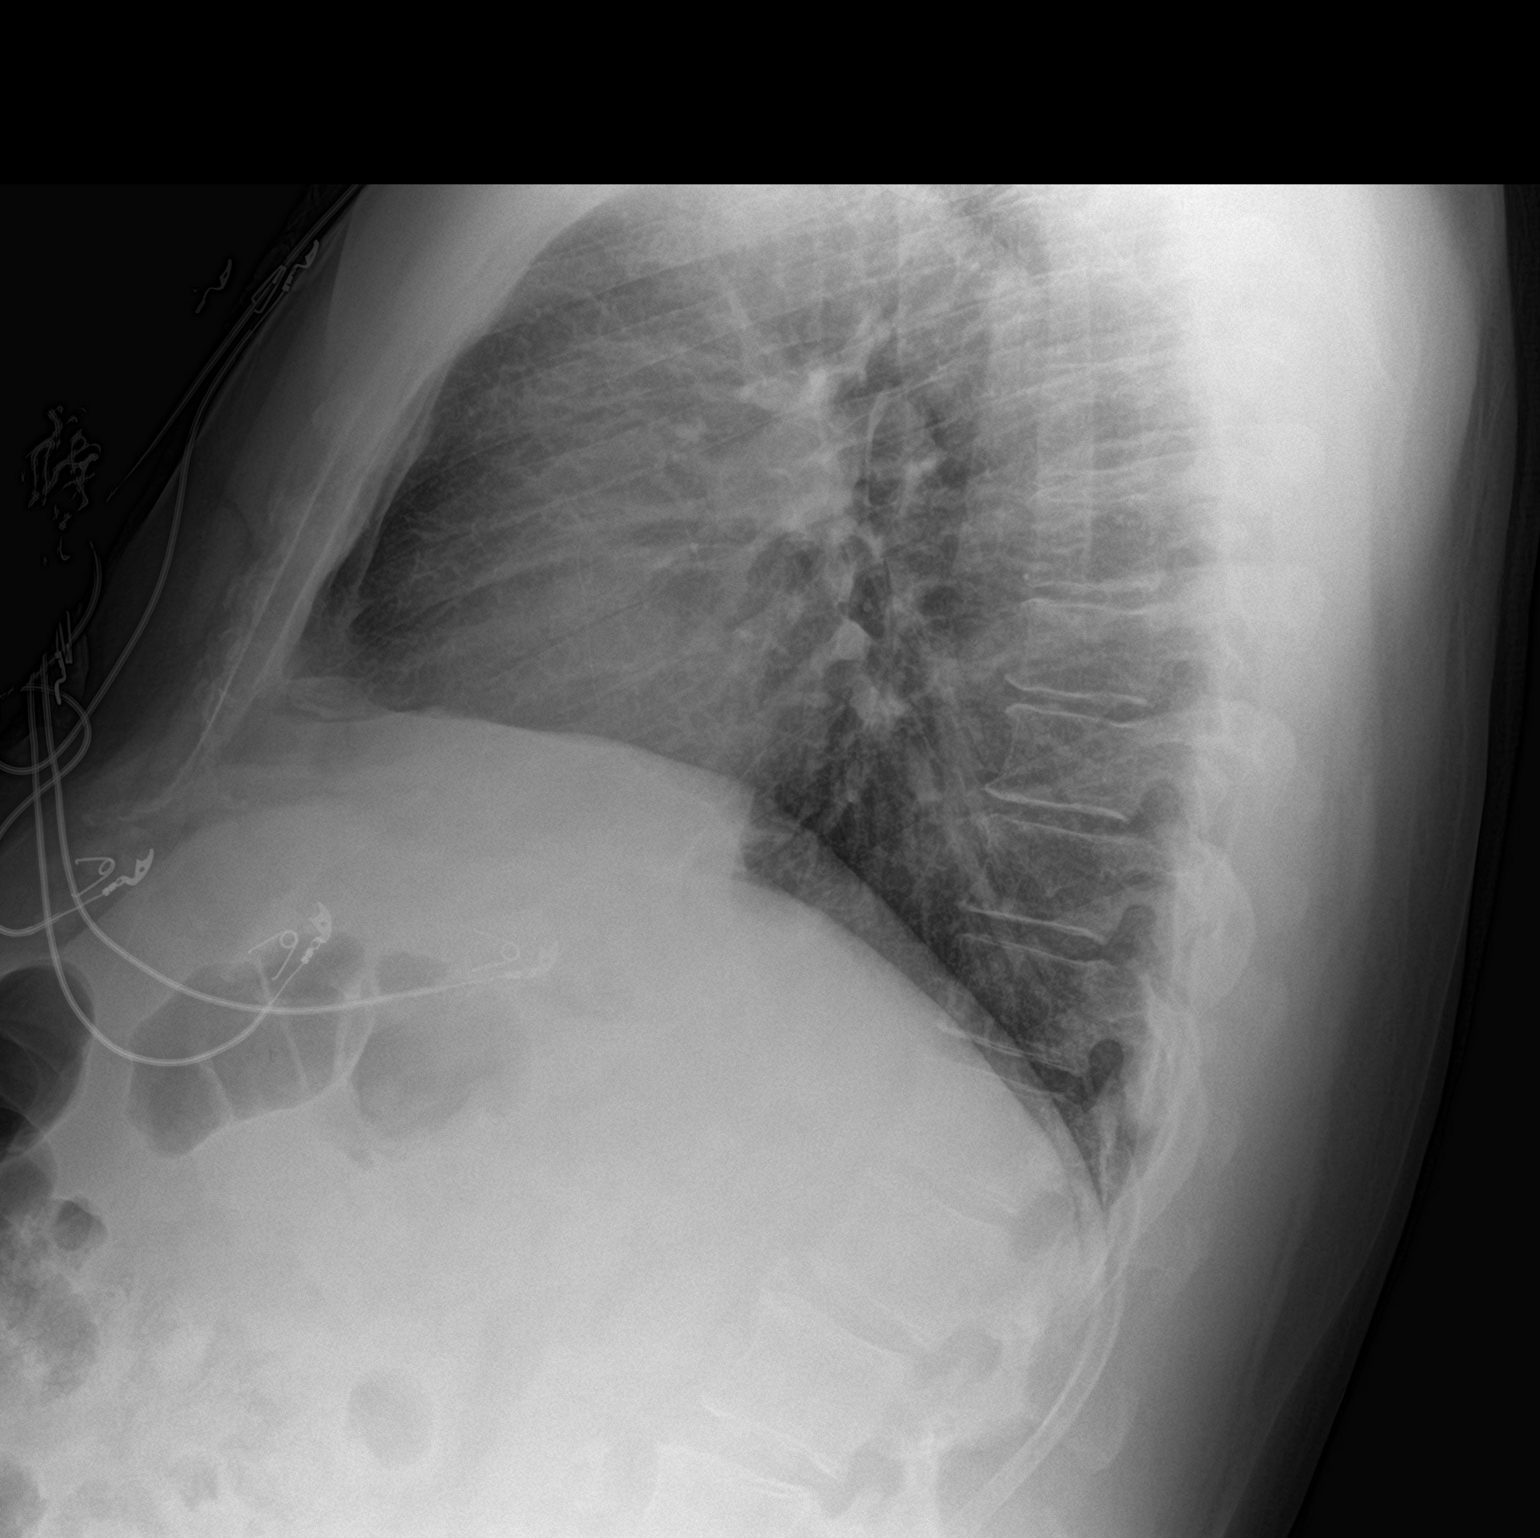

[2 of 2 positions shown; findings below may reference images not displayed]

FINDINGS: The heart size and mediastinal contours are within normal limits.
Both lungs are clear. The visualized skeletal structures are
unremarkable.
IMPRESSION: No active cardiopulmonary disease.
